# Patient Record
Sex: Female | Born: 2000 | Race: Black or African American | Hispanic: No | Marital: Single | Smoking: Never smoker
Health system: Southern US, Community
[De-identification: ages and names within clinical notes are randomized; demographics above are authoritative.]

## PROBLEM LIST (undated history)

## (undated) DIAGNOSIS — R000 Tachycardia, unspecified: Secondary | ICD-10-CM

---
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
## 2021-01-12 LAB — CULTURE, FUNGUS WITHOUT SMEAR

## 2021-07-06 ENCOUNTER — Other Ambulatory Visit (HOSPITAL_COMMUNITY): Payer: Self-pay | Admitting: Physician Assistant

## 2021-07-06 DIAGNOSIS — M7989 Other specified soft tissue disorders: Secondary | ICD-10-CM

## 2021-07-12 ENCOUNTER — Ambulatory Visit (HOSPITAL_COMMUNITY)
Admission: RE | Admit: 2021-07-12 | Discharge: 2021-07-12 | Disposition: A | Discharge status: Home / Self Care | Payer: BC Managed Care – PPO | Source: Ambulatory Visit | Attending: Physician Assistant | Admitting: Physician Assistant

## 2021-07-12 ENCOUNTER — Other Ambulatory Visit (HOSPITAL_COMMUNITY): Payer: Self-pay | Admitting: Physician Assistant

## 2021-07-12 ENCOUNTER — Other Ambulatory Visit: Payer: Self-pay

## 2021-07-12 DIAGNOSIS — M7989 Other specified soft tissue disorders: Secondary | ICD-10-CM

## 2021-08-04 ENCOUNTER — Emergency Department (HOSPITAL_COMMUNITY)
Admission: EM | Admit: 2021-08-04 | Discharge: 2021-08-04 | Disposition: A | Discharge status: Home / Self Care | Payer: BC Managed Care – PPO | Attending: Emergency Medicine | Admitting: Emergency Medicine

## 2021-08-04 ENCOUNTER — Encounter (HOSPITAL_COMMUNITY): Payer: Self-pay | Admitting: *Deleted

## 2021-08-04 DIAGNOSIS — M79605 Pain in left leg: Secondary | ICD-10-CM | POA: Diagnosis not present

## 2021-08-04 DIAGNOSIS — Z5321 Procedure and treatment not carried out due to patient leaving prior to being seen by health care provider: Secondary | ICD-10-CM | POA: Insufficient documentation

## 2021-08-04 NOTE — ED Triage Notes (Signed)
Left leg pain x 2 weeks

## 2022-03-13 IMAGING — MR MR THORACIC SPINE WO/W CM
7 of 9 series · 29 of 48 positions shown · IV contrast (gadavist)
Comparison: None.

CLINICAL DATA: Headache and fever.  Sepsis.

EXAM:
MRI THORACIC WITHOUT AND WITH CONTRAST
TECHNIQUE: Multiplanar and multiecho pulse sequences of the thoracic spine were
obtained without and with intravenous contrast.
CONTRAST:  7.5mL GADAVIST GADOBUTROL 1 MMOL/ML IV SOLN

[Series 18: T1 · sagittal · 3.3mm · 0.62mm/px · 1 of 9 slices shown (1 of 3)]
[im 1/9]
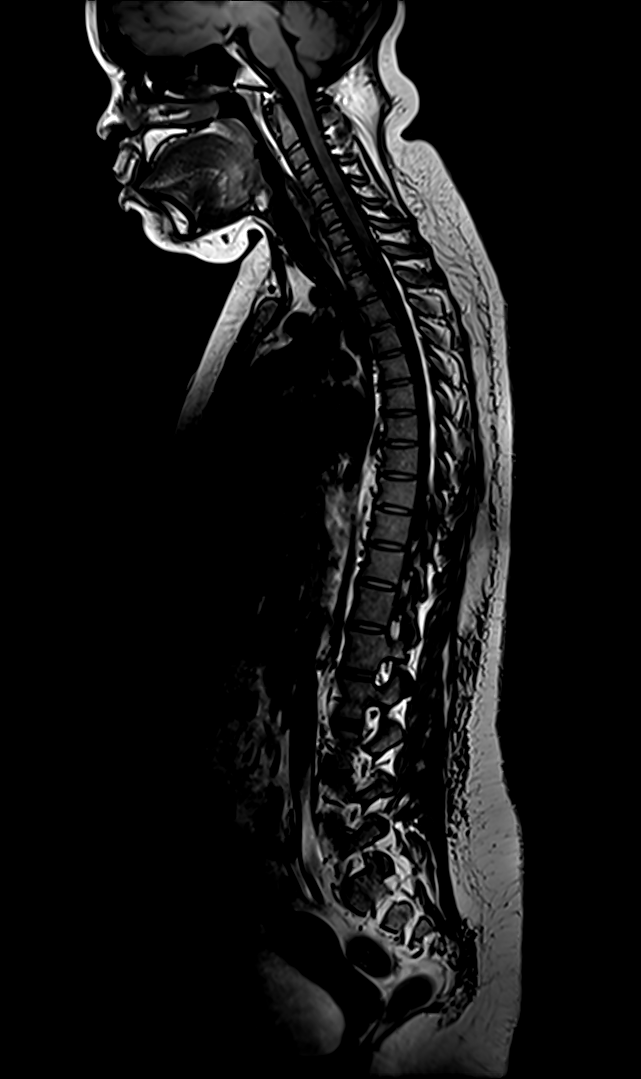

[Series 19: T2 · sagittal · 3.0mm · 1.06mm/px · 3 of 17 slices shown (1 of 2)]
[im 1/17]
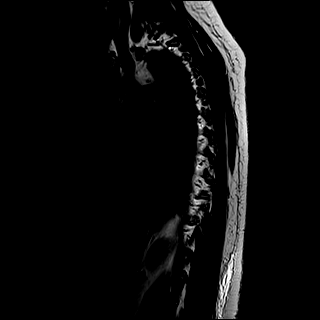
[im 9/17]
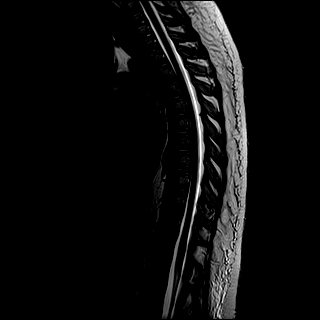
[im 17/17]
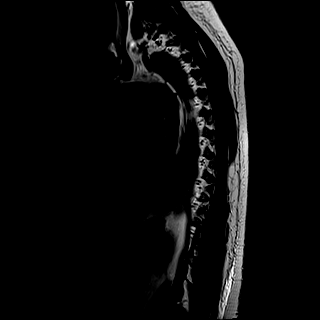

[Series 20: T1 · sagittal · 3.0mm · 1.33mm/px · 4 of 17 slices shown (2 of 3)]
[im 1/17]
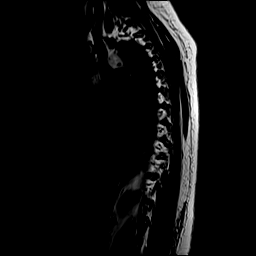
[im 6/17]
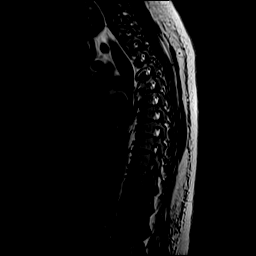
[im 11/17]
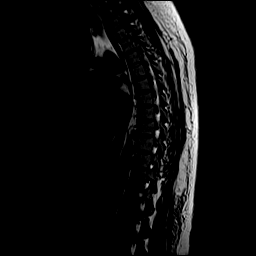
[im 17/17]
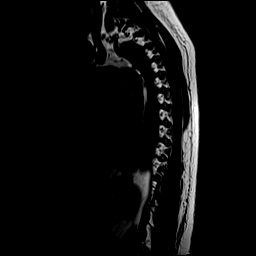

[Series 21: STIR · sagittal · 3.0mm · 0.53mm/px · 1 of 17 slices shown]
[im 1/17]
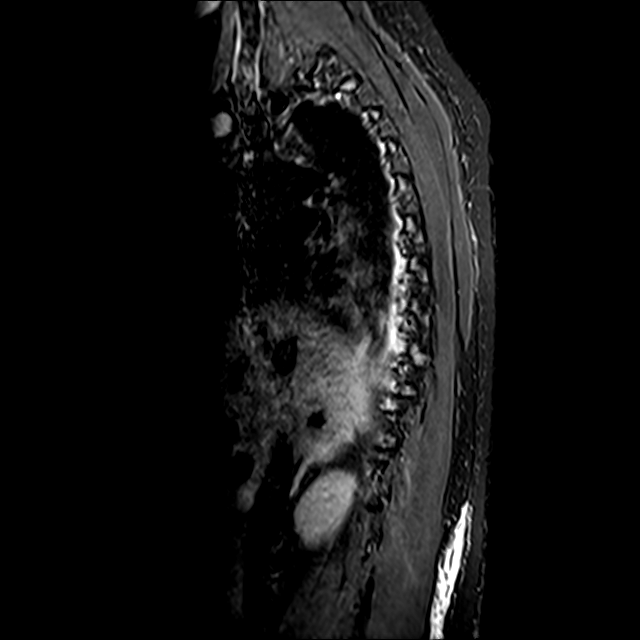

[Series 22: T2 · axial · 5.0mm · 0.74mm/px · z∈[-289,-78]mm · 8 of 39 slices shown (2 of 2)]
[im 1/39]
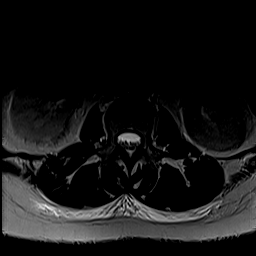
[im 6/39]
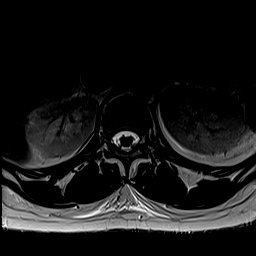
[im 11/39]
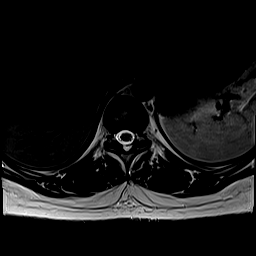
[im 17/39]
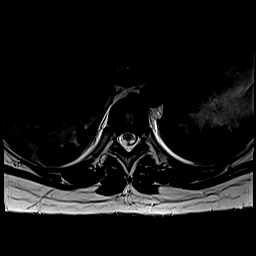
[im 22/39]
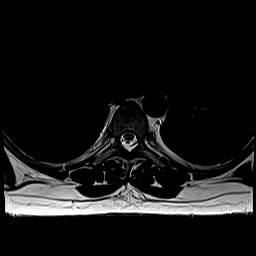
[im 28/39]
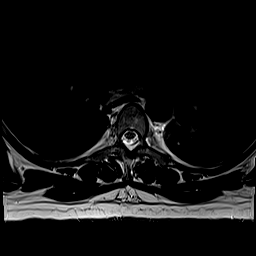
[im 33/39]
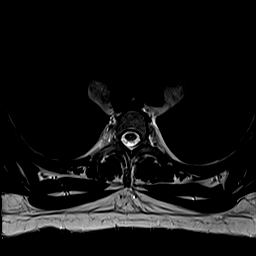
[im 39/39]
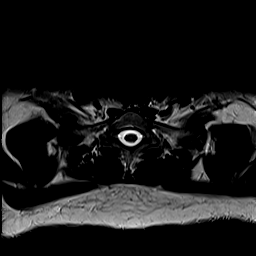

[Series 24: T1 · axial · non-contrast · 5.0mm · 0.37mm/px · z∈[-289,-78]mm · 8 of 39 slices shown (3 of 3)]
[im 1/39]
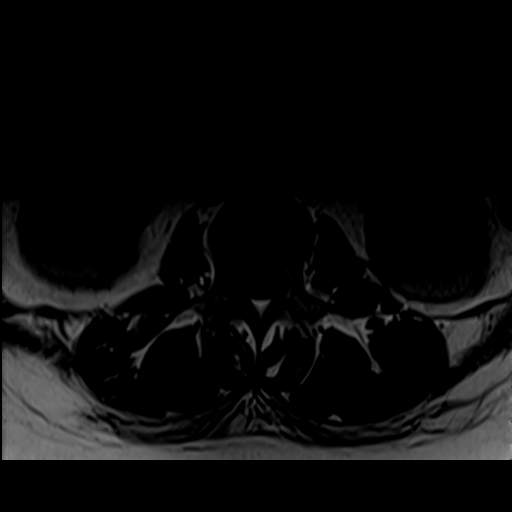
[im 6/39]
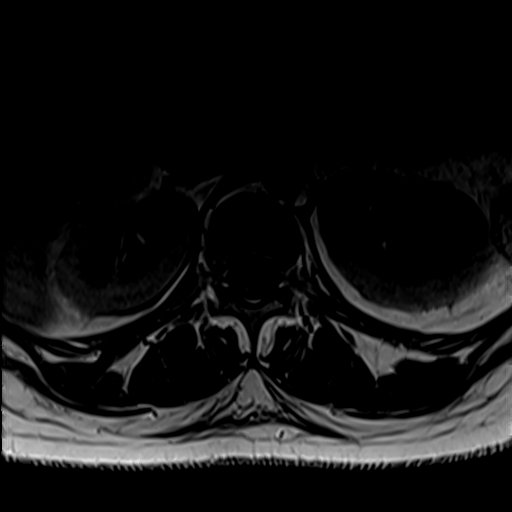
[im 11/39]
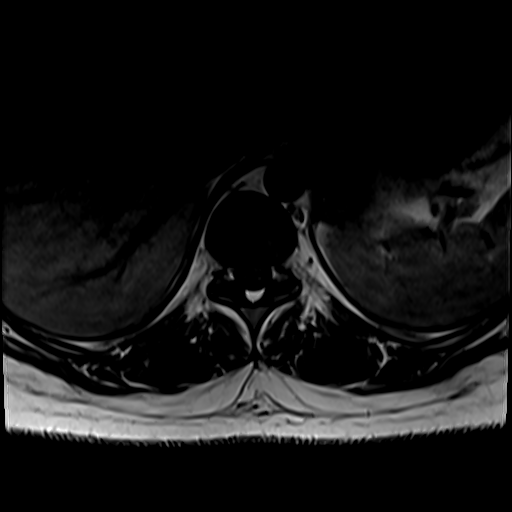
[im 17/39]
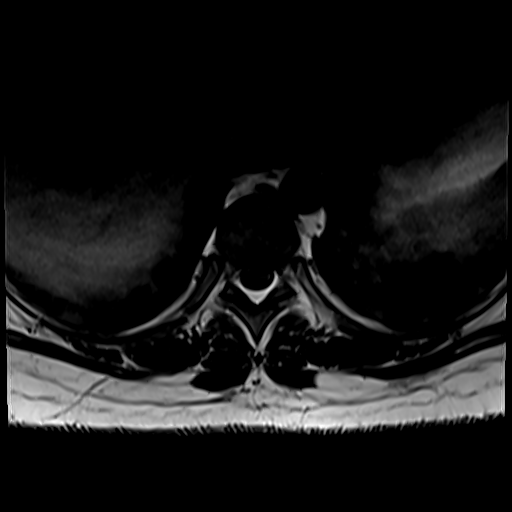
[im 22/39]
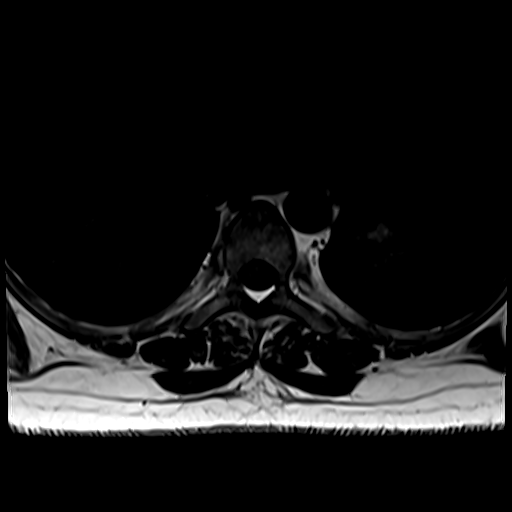
[im 28/39]
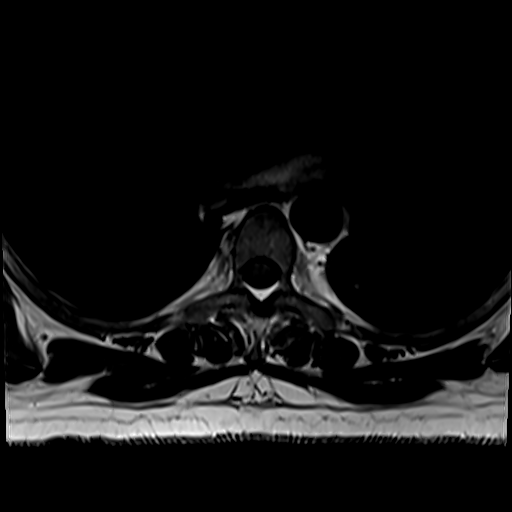
[im 33/39]
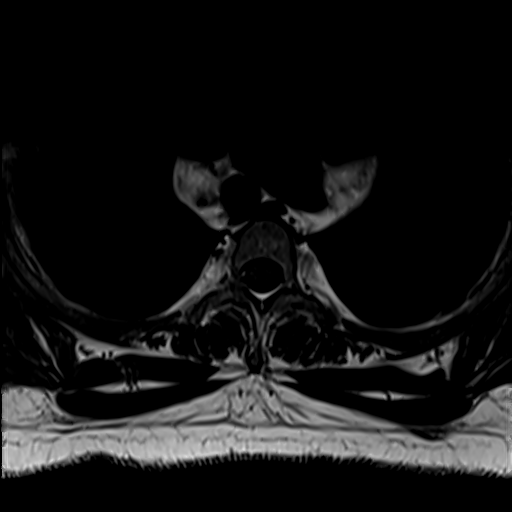
[im 39/39]
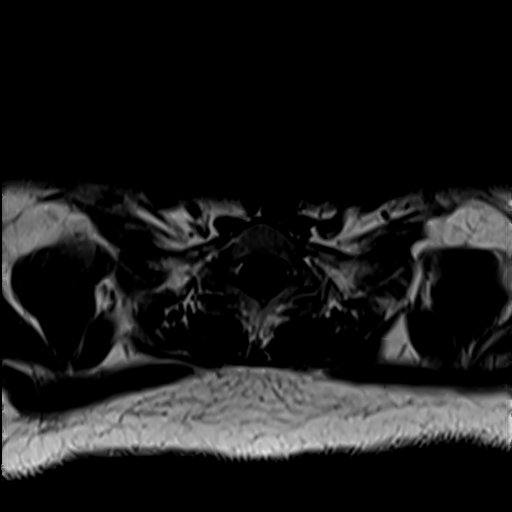

[Series 25: T1 fat-sat post-contrast · sagittal · 3.0mm · 1.06mm/px · 4 of 17 slices shown]
[im 1/17]
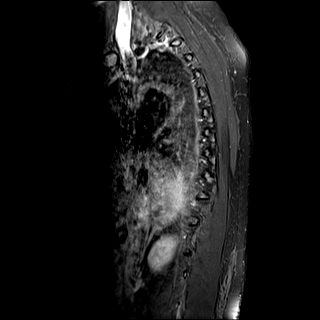
[im 6/17]
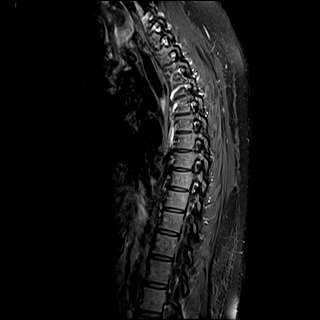
[im 11/17]
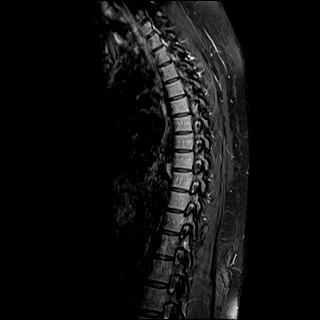
[im 17/17]
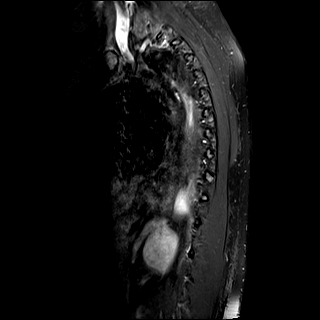

[29 of 48 positions shown; findings below may reference images not displayed]

FINDINGS: Alignment:  Normal

Vertebrae: Normal bone marrow. Negative for fracture or mass. No
evidence of spinal infection. Normal enhancement of the spine.

Cord:  Normal signal and morphology.  No cord compression.

Paraspinal and other soft tissues: Negative for paraspinous mass
edema or abscess. No pleural effusion.

Disc levels:

Normal disc spaces throughout
IMPRESSION: Normal MRI thoracic spine with contrast.

## 2022-03-13 IMAGING — MR MR CERVICAL SPINE WO/W CM
6 of 8 series · 32 of 48 positions shown · IV contrast (7.5 ml Gadavist)
Comparison: None.

CLINICAL DATA: Back pain with fever.

EXAM:
MRI CERVICAL SPINE WITHOUT AND WITH CONTRAST
TECHNIQUE: Multiplanar and multiecho pulse sequences of the cervical spine, to
include the craniocervical junction and cervicothoracic junction,
were obtained without and with intravenous contrast.
CONTRAST:  7.5mL GADAVIST GADOBUTROL 1 MMOL/ML IV SOLN

[Series 1: T2 · sagittal · 3.0mm · 0.62mm/px · 4 of 15 slices shown (1 of 2)]
[im 1/15]
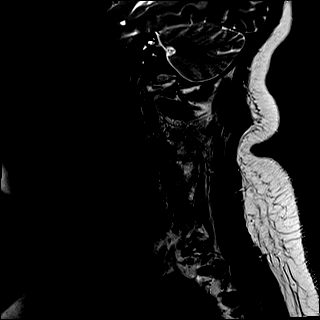
[im 5/15]
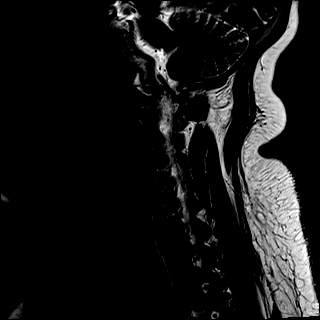
[im 10/15]
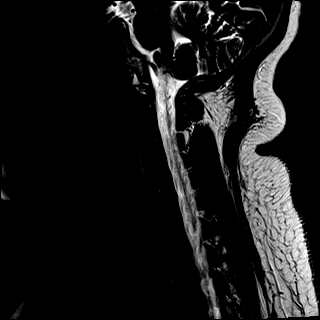
[im 15/15]
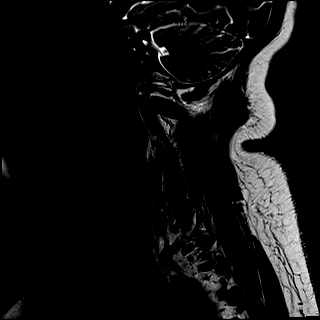

[Series 2: T1 · sagittal · 3.0mm · 0.62mm/px · 4 of 15 slices shown (1 of 2)]
[im 1/15]
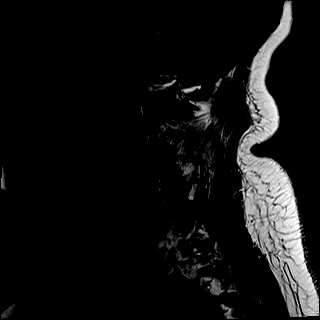
[im 5/15]
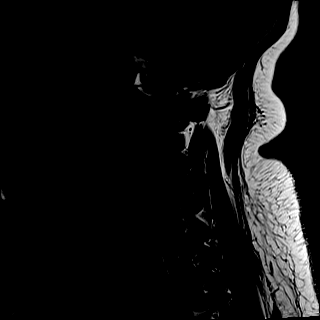
[im 10/15]
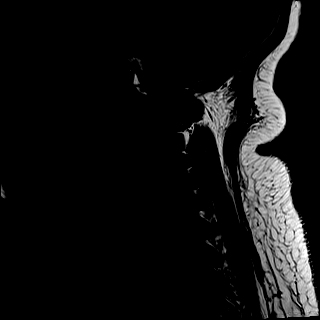
[im 15/15]
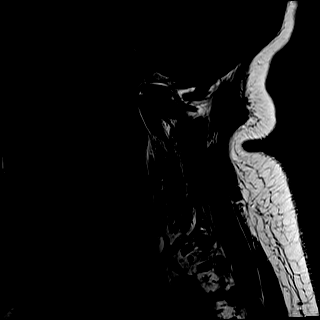

[Series 3: STIR · sagittal · 3.0mm · 0.78mm/px · 4 of 15 slices shown]
[im 1/15]
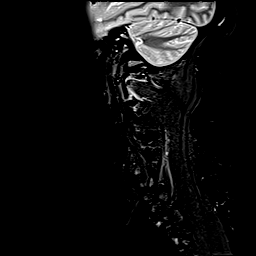
[im 5/15]
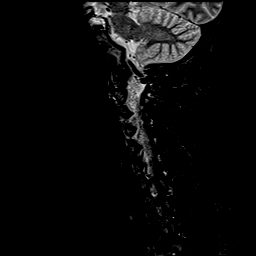
[im 10/15]
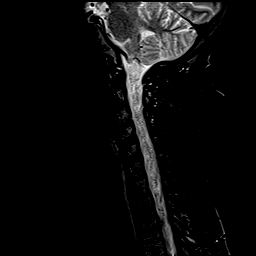
[im 15/15]
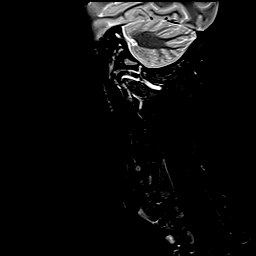

[Series 4: T2 · axial · 3.0mm · 0.66mm/px · z∈[-78,+26]mm · 8 of 36 slices shown (2 of 2)]
[im 1/36]
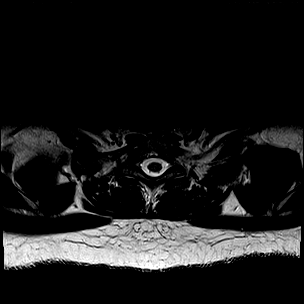
[im 6/36]
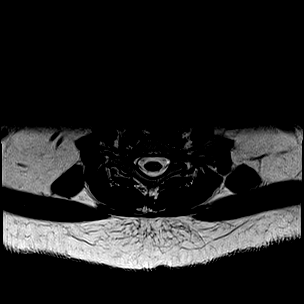
[im 11/36]
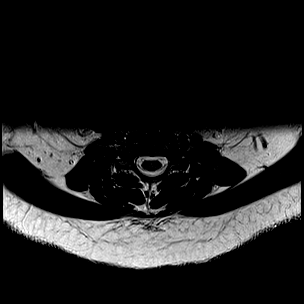
[im 16/36]
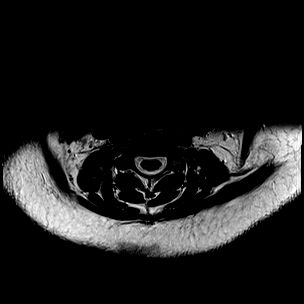
[im 21/36]
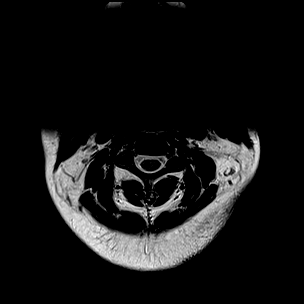
[im 26/36]
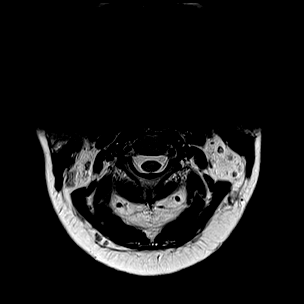
[im 31/36]
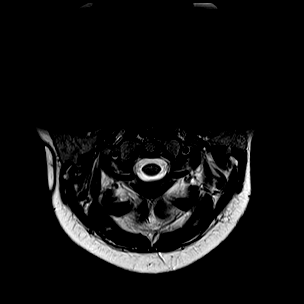
[im 36/36]
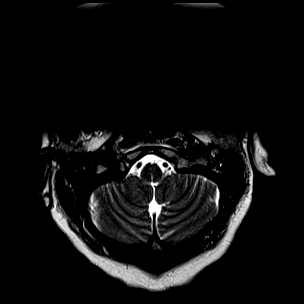

[Series 7: T1 · axial · non-contrast · 3.0mm · 0.35mm/px · z∈[-73,+31]mm · 8 of 36 slices shown (2 of 2)]
[im 1/36]
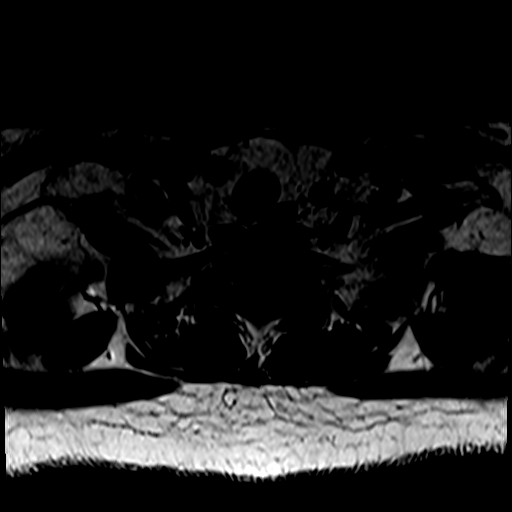
[im 6/36]
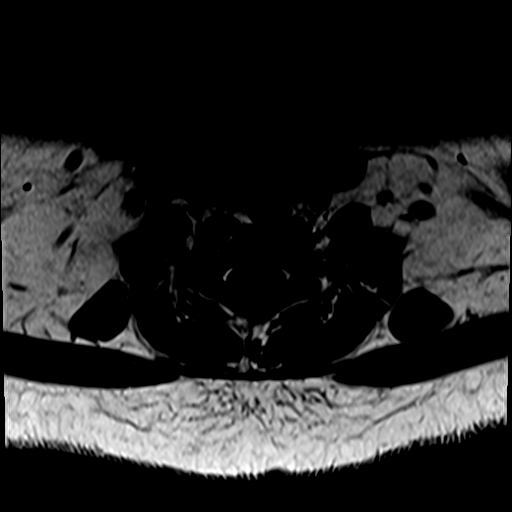
[im 11/36]
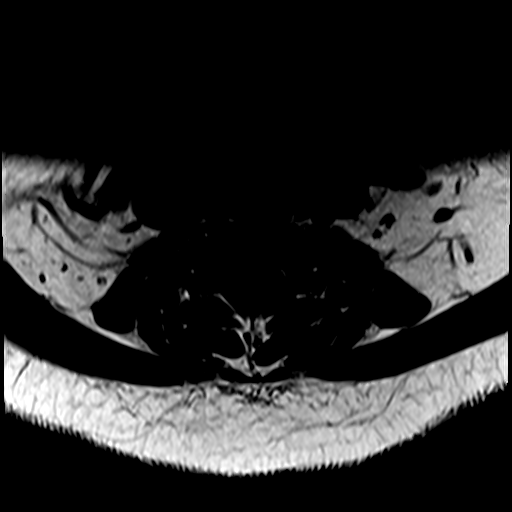
[im 16/36]
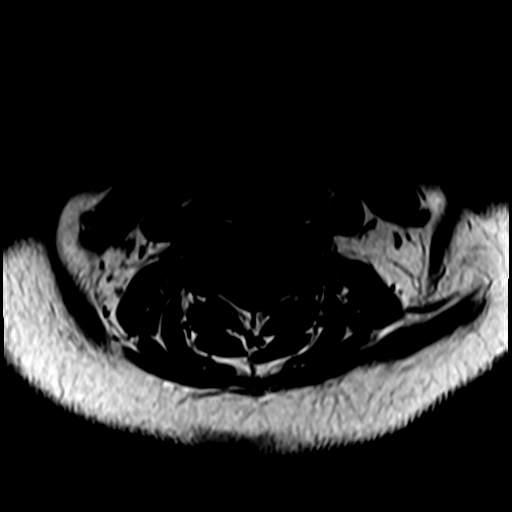
[im 21/36]
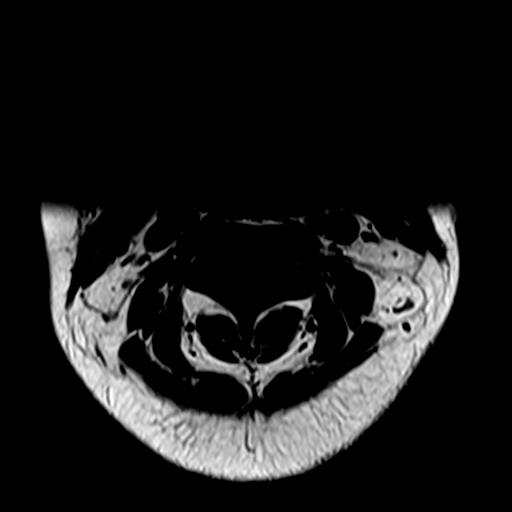
[im 26/36]
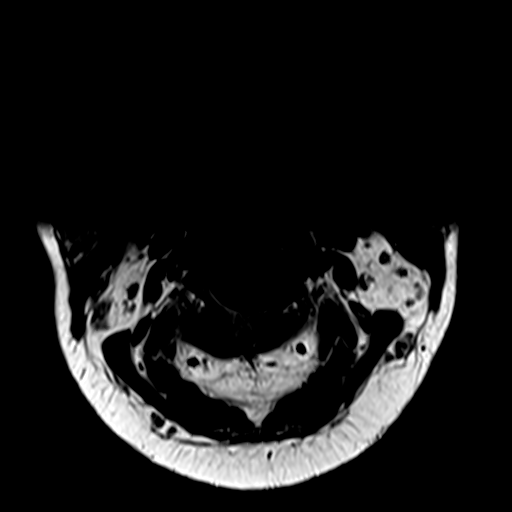
[im 31/36]
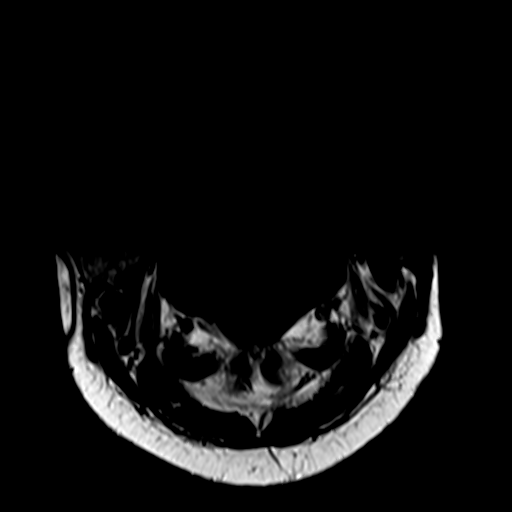
[im 36/36]
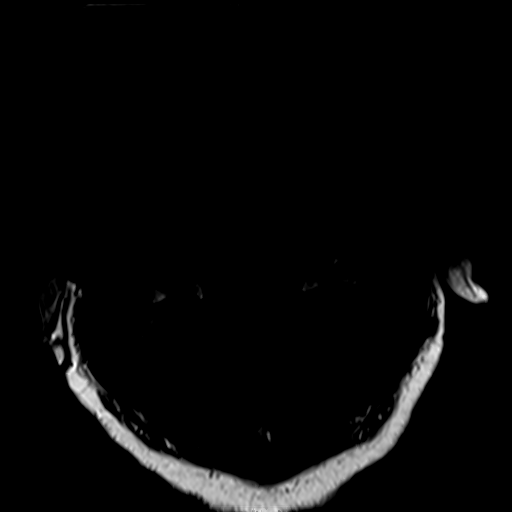

[Series 8: T1 post-contrast · sagittal · 3.0mm · 0.39mm/px · 4 of 15 slices shown]
[im 1/15]
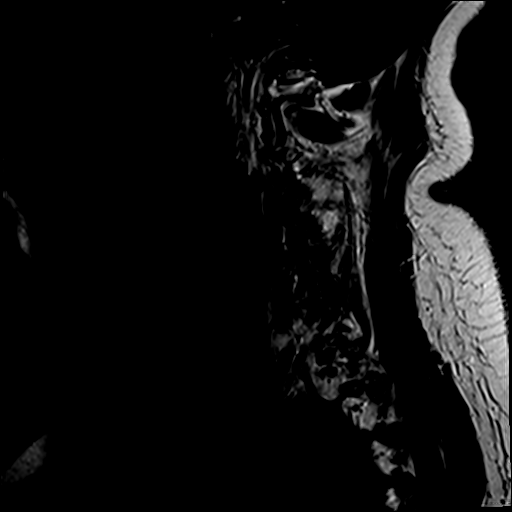
[im 5/15]
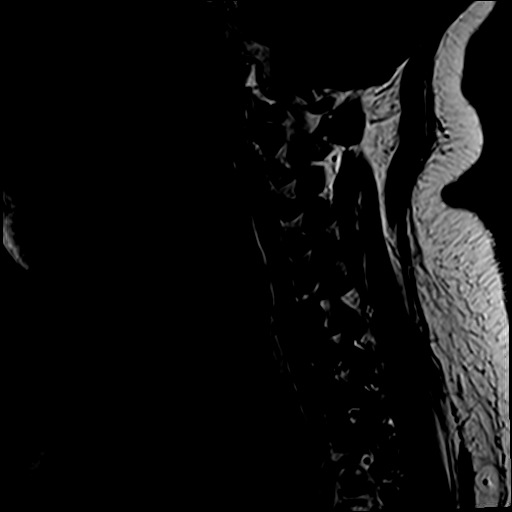
[im 10/15]
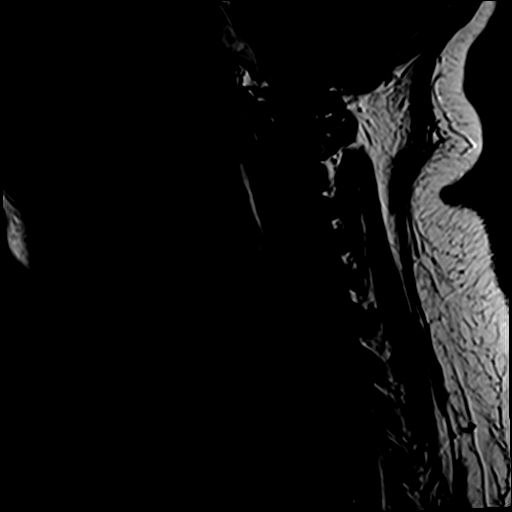
[im 15/15]
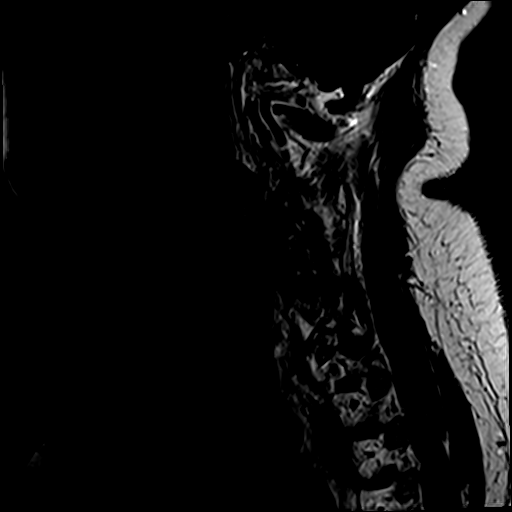

[32 of 48 positions shown; findings below may reference images not displayed]

FINDINGS: Alignment: Normal

Vertebrae: Normal bone marrow. Negative for fracture or mass. No
evidence of spinal infection.

Cord: Normal signal and morphology

Posterior Fossa, vertebral arteries, paraspinal tissues: Negative
for soft tissue edema or abscess.

Disc levels:

Normal disc spaces.  No disc protrusion or neural impingement.
IMPRESSION: Normal MRI cervical spine with contrast.

## 2023-03-31 ENCOUNTER — Encounter (HOSPITAL_COMMUNITY): Payer: Self-pay

## 2023-03-31 ENCOUNTER — Other Ambulatory Visit: Payer: Self-pay

## 2023-03-31 ENCOUNTER — Emergency Department (HOSPITAL_COMMUNITY)
Admission: EM | Admit: 2023-03-31 | Discharge: 2023-03-31 | Disposition: A | Discharge status: Home / Self Care | Payer: BC Managed Care – PPO | Source: Home / Self Care | Attending: Emergency Medicine | Admitting: Emergency Medicine

## 2023-03-31 DIAGNOSIS — R519 Headache, unspecified: Secondary | ICD-10-CM | POA: Diagnosis not present

## 2023-03-31 DIAGNOSIS — N3001 Acute cystitis with hematuria: Secondary | ICD-10-CM | POA: Diagnosis not present

## 2023-03-31 DIAGNOSIS — D649 Anemia, unspecified: Secondary | ICD-10-CM | POA: Diagnosis not present

## 2023-03-31 DIAGNOSIS — Z20822 Contact with and (suspected) exposure to covid-19: Secondary | ICD-10-CM | POA: Insufficient documentation

## 2023-03-31 DIAGNOSIS — R000 Tachycardia, unspecified: Secondary | ICD-10-CM | POA: Diagnosis not present

## 2023-03-31 DIAGNOSIS — R339 Retention of urine, unspecified: Secondary | ICD-10-CM | POA: Diagnosis present

## 2023-03-31 DIAGNOSIS — L299 Pruritus, unspecified: Secondary | ICD-10-CM | POA: Diagnosis not present

## 2023-03-31 HISTORY — DX: Tachycardia, unspecified: R00.0

## 2023-03-31 LAB — CBC WITH DIFFERENTIAL/PLATELET
Abs Immature Granulocytes: 0.02 10*3/uL (ref 0.00–0.07)
Basophils Absolute: 0 10*3/uL (ref 0.0–0.1)
Basophils Relative: 0 %
Eosinophils Absolute: 0.3 10*3/uL (ref 0.0–0.5)
Eosinophils Relative: 4 %
HCT: 32.7 % — ABNORMAL LOW (ref 36.0–46.0)
Hemoglobin: 10.1 g/dL — ABNORMAL LOW (ref 12.0–15.0)
Immature Granulocytes: 0 %
Lymphocytes Relative: 6 %
Lymphs Abs: 0.4 10*3/uL — ABNORMAL LOW (ref 0.7–4.0)
MCH: 20.1 pg — ABNORMAL LOW (ref 26.0–34.0)
MCHC: 30.9 g/dL (ref 30.0–36.0)
MCV: 65.1 fL — ABNORMAL LOW (ref 80.0–100.0)
Monocytes Absolute: 0.2 10*3/uL (ref 0.1–1.0)
Monocytes Relative: 3 %
Neutro Abs: 6.3 10*3/uL (ref 1.7–7.7)
Neutrophils Relative %: 87 %
Platelets: 360 10*3/uL (ref 150–400)
RBC: 5.02 MIL/uL (ref 3.87–5.11)
RDW: 20.6 % — ABNORMAL HIGH (ref 11.5–15.5)
WBC: 7.3 10*3/uL (ref 4.0–10.5)
nRBC: 0 % (ref 0.0–0.2)

## 2023-03-31 LAB — URINALYSIS, ROUTINE W REFLEX MICROSCOPIC
Glucose, UA: NEGATIVE mg/dL
Hgb urine dipstick: NEGATIVE
Ketones, ur: NEGATIVE mg/dL
Leukocytes,Ua: NEGATIVE
Nitrite: POSITIVE — AB
Protein, ur: 100 mg/dL — AB
Specific Gravity, Urine: 1.033 — ABNORMAL HIGH (ref 1.005–1.030)
pH: 5 (ref 5.0–8.0)

## 2023-03-31 LAB — TSH: TSH: 0.877 u[IU]/mL (ref 0.350–4.500)

## 2023-03-31 LAB — BASIC METABOLIC PANEL
Anion gap: 12 (ref 5–15)
BUN: 9 mg/dL (ref 6–20)
CO2: 18 mmol/L — ABNORMAL LOW (ref 22–32)
Calcium: 8.2 mg/dL — ABNORMAL LOW (ref 8.9–10.3)
Chloride: 102 mmol/L (ref 98–111)
Creatinine, Ser: 0.65 mg/dL (ref 0.44–1.00)
GFR, Estimated: >60 mL/min (ref >60)
Glucose, Bld: 129 mg/dL — ABNORMAL HIGH (ref 70–99)
Potassium: 3.2 mmol/L — ABNORMAL LOW (ref 3.5–5.1)
Sodium: 132 mmol/L — ABNORMAL LOW (ref 135–145)

## 2023-03-31 LAB — SARS CORONAVIRUS 2 BY RT PCR: SARS Coronavirus 2 by RT PCR: NEGATIVE

## 2023-03-31 MED ORDER — DEXAMETHASONE SODIUM PHOSPHATE 10 MG/ML IJ SOLN
10.0000 mg | Freq: Once | INTRAMUSCULAR | Status: AC
  Administered 2023-03-31: 10 mg via INTRAVENOUS
  Filled 2023-03-31: qty 1

## 2023-03-31 MED ORDER — KETOROLAC TROMETHAMINE 10 MG PO TABS
10.0000 mg | ORAL_TABLET | Freq: Once | ORAL | Status: AC
  Administered 2023-03-31: 10 mg via ORAL
  Filled 2023-03-31: qty 1

## 2023-03-31 MED ORDER — POTASSIUM CHLORIDE CRYS ER 20 MEQ PO TBCR
40.0000 meq | EXTENDED_RELEASE_TABLET | Freq: Once | ORAL | Status: AC
  Administered 2023-03-31: 40 meq via ORAL
  Filled 2023-03-31: qty 2

## 2023-03-31 MED ORDER — LACTATED RINGERS IV BOLUS
1000.0000 mL | Freq: Once | INTRAVENOUS | Status: AC
  Administered 2023-03-31: 1000 mL via INTRAVENOUS

## 2023-03-31 MED ORDER — PROPRANOLOL HCL 10 MG PO TABS: 10.0000 mg | ORAL_TABLET | Freq: Three times a day (TID) | ORAL | 0 refills | Status: AC

## 2023-03-31 MED ORDER — CEPHALEXIN 500 MG PO CAPS: 500.0000 mg | ORAL_CAPSULE | Freq: Once | ORAL | Status: DC

## 2023-03-31 MED ORDER — DIPHENHYDRAMINE HCL 50 MG/ML IJ SOLN
25.0000 mg | Freq: Once | INTRAMUSCULAR | Status: AC
  Administered 2023-03-31: 25 mg via INTRAVENOUS
  Filled 2023-03-31: qty 1

## 2023-03-31 MED ORDER — CEFDINIR 300 MG PO CAPS
600.0000 mg | ORAL_CAPSULE | Freq: Once | ORAL | Status: AC
  Administered 2023-03-31: 600 mg via ORAL
  Filled 2023-03-31: qty 2

## 2023-03-31 NOTE — ED Provider Notes (Signed)
[place] EMERGENCY DEPARTMENT AT [facility] Provider Note   CSN: [id number] Arrival date & time: 03/31/23  1452     History  Chief Complaint  Patient presents with   Headache    Anna Walter is a 22 y.o. female.  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  Has history of tachycardia, previously on propranolol but was stopped by her PCP months ago because she was having "head rushes" to see if this would help, tachycardia has continued so she is hoping she can go back on this.  She  states her usual heart rate when unmedicated is around 130. Presents the ER today complaining of subjective fever at home, body aches, and chills.  Started yesterday, she was seen yesterday at the ER at [facility].  She states she was noted to have a fast heart rate  She states she has CT scan of her chest to rule out a blood clot that was normal, had labs and urinalysis, she states the only abnormal finding was a UTI.  She did get some IV fluids which helped improve her heart rate.  She states it was 150s yesterday.   NO confusion, documented fever, neck stiffness, or other neuro findings that would suggest meningitis or other neurologic problem.    Sent with prescription for Macrobid.  The second toe total doses but did not take any additional view started having itching all over.  She took Benadryl last night with some relief.  Has not had any additional Benadryl today.  Improved with Benadryl today as well she has no hives, no mucosal lesions will give her a dose of dexamethasone as well.  She did also note that she has been taking Pyridium for her dysuria which she is also going to discontinue as she is not sure if it is the Pyridium or the Macrobid she is allergic to  Headache      Home Medications Prior to Admission medications   Medication Sig Start Date End Date Taking? Authorizing Provider   propranolol (INDERAL) 10 MG tablet Take 1 tablet (10 mg total) by mouth 3 (three) times daily. 03/31/23  Yes ,  A, PA-C  ferrous sulfate 325 (65 FE) MG EC tablet Take 325 mg by mouth every other day. 12/07/20 12/07/21  [provider]  gabapentin (NEURONTIN) 100 MG capsule Take 100 mg by mouth daily. 10/06/20   [provider]  loratadine (CLARITIN) 10 MG tablet Take 10 mg by mouth daily. 10/07/20   [provider]  mirabegron ER (MYRBETRIQ) 50 MG TB24 tablet Take by mouth. Patient not taking: No sig reported    [provider]  montelukast (SINGULAIR) 10 MG tablet Take 10 mg by mouth daily. 10/06/20   [provider]  pantoprazole (PROTONIX) 40 MG tablet Take 1 tablet by mouth daily. 12/16/20   [provider]  predniSONE (DELTASONE) 10 MG tablet Take 30 mg by mouth daily with breakfast. 11/03/20   [provider]  traZODone (DESYREL) 50 MG tablet Take 50 mg by mouth at bedtime. 12/16/20   [provider]      Allergies    Sulfa antibiotics and Sulfamethoxazole-trimethoprim    Review of Systems   Review of Systems  Neurological:  Positive for headaches.    Physical Exam Updated Vital Signs BP 132/74   Pulse (!) 126   Temp 98.9 F (37.2 C) (Oral)   Resp 14   Ht 5'  2" (1.575 m)   Wt 86.2 kg   LMP 03/15/2023   SpO2 98%   BMI 34.75 kg/m  Physical Exam Vitals and nursing note reviewed.  Constitutional:      General: She is not in acute distress.    Appearance: She is well-developed.  HENT:     Head: Normocephalic and atraumatic.  Eyes:     Extraocular Movements: Extraocular movements intact.     Conjunctiva/sclera: Conjunctivae normal.     Pupils: Pupils are equal, round, and reactive to light.  Cardiovascular:     Rate and Rhythm: Regular rhythm. Tachycardia present.     Heart sounds: No murmur heard. Pulmonary:     Effort: Pulmonary effort is normal. No respiratory distress.     Breath sounds:  Normal breath sounds.  Abdominal:     Palpations: Abdomen is soft.     Tenderness: There is no abdominal tenderness.  Musculoskeletal:        General: No swelling. Normal range of motion.     Cervical back: Neck supple.  Skin:    General: Skin is warm and dry.     Capillary Refill: Capillary refill takes less than 2 seconds.  Neurological:     Mental Status: She is alert and oriented to person, place, and time.     GCS: GCS eye subscore is 4. GCS verbal subscore is 5. GCS motor subscore is 6.     Cranial Nerves: No facial asymmetry.     Sensory: No sensory deficit.     Motor: No weakness.     Gait: Gait normal.  Psychiatric:        Mood and Affect: Mood normal.        Behavior: Behavior normal.     ED Results / Procedures / Treatments   Labs (all labs ordered are listed, but only abnormal results are displayed) Labs Reviewed  CBC WITH DIFFERENTIAL/PLATELET - Abnormal; Notable for the following components:      Result Value   Hemoglobin 10.1 (*)    HCT 32.7 (*)    MCV 65.1 (*)    MCH 20.1 (*)    RDW 20.6 (*)    Lymphs Abs 0.4 (*)    All other components within normal limits  BASIC METABOLIC PANEL - Abnormal; Notable for the following components:   Sodium 132 (*)    Potassium 3.2 (*)    CO2 18 (*)    Glucose, Bld 129 (*)    Calcium 8.2 (*)    All other components within normal limits  URINALYSIS, ROUTINE W REFLEX MICROSCOPIC - Abnormal; Notable for the following components:   Color, Urine AMBER (*)    APPearance HAZY (*)    Specific Gravity, Urine 1.033 (*)    Bilirubin Urine SMALL (*)    Protein, ur 100 (*)    Nitrite POSITIVE (*)    Bacteria, UA RARE (*)    All other components within normal limits  SARS CORONAVIRUS 2 BY RT PCR  URINE CULTURE  TSH    EKG EKG Interpretation Date/Time:  Sunday March 31 2023 18:28:09 EDT Ventricular Rate:  119 PR Interval:  130 QRS Duration:  77 QT Interval:  297 QTC Calculation: 418 R Axis:   62  Text  Interpretation: Sinus tachycardia Low voltage, precordial leads Borderline T abnormalities, diffuse leads since last tracing no significant change Confirmed by Eber Hong ([id number]) on 03/31/2023 6:32:38 PM  Radiology No results found.  Procedures Procedures    Medications Ordered in ED  Medications  cefdinir (OMNICEF) capsule 600 mg (has no administration in time range)  dexamethasone (DECADRON) injection 10 mg (has no administration in time range)  ketorolac (TORADOL) tablet 10 mg (has no administration in time range)  lactated ringers bolus 1,000 mL (1,000 mLs Intravenous New Bag/Given 03/31/23 1717)  diphenhydrAMINE (BENADRYL) injection 25 mg (25 mg Intravenous Given 03/31/23 1717)  potassium chloride SA (KLOR-CON M) CR tablet 40 mEq (40 mEq Oral Given 03/31/23 1723)    ED Course/ Medical Decision Making/ A&P Clinical Course as of 03/31/23 1919  Sun Mar 31, 2023  1903 TSH [CB]    Clinical Course User Index [CB] Ma Rings, [place]                                 Medical Decision Making This patient presents to the ED for concern of bodyaches and headaches and subjective fever, this involves an extensive number of treatment options, and is a complaint that carries with it a high risk of complications and morbidity.  The differential diagnosis includes viral illness, sepsis, UTI, COVID-19, meningitis, dehydration, other   Co morbidities that complicate the patient evaluation  Urinary retention, tachycardia   Additional history obtained:  Additional history obtained from EMR External records from outside source obtained and reviewed including notes   Lab Tests:  I Ordered, and personally interpreted labs.  The pertinent results include: Urinalysis significant for UTI, CBC shows mild anemia with hemoglobin of 10.1.  MP shows mild hyponatremia, mild hypokalemia, given oral repletion for her potassium.     Problem List / ED Course / Critical interventions / Medication  management  Tachycardia-patient has baseline tachycardia has off of her propranolol, initially tachycardia to 140s but resolved to 120s to 130s without intervention, 5115 after IV fluids, she has no fever, no leukocytosis she is not hypotensive.  Reports full workup at [facility] ED yesterday.  Unfortunately unable to assess these records but patient is able to tell me she had UTI, was told labs were reassuring and had negative CTA of her chest.  Labs here today are also reassuring, negative COVID.  Itching-started having itching after starting Macrobid for UTI, possibly milder allergic reaction, feels better after Benadryl.  Will plan to stop Macrobid and switch her to cephalexin.   Reevaluation of the patient after these medicines showed that the patient improved I have reviewed the patients home medicines and have made adjustments as needed    Test / Admission - Considered:  Imaging of chest but patient had negative CTA of her chest yesterday    Amount and/or Complexity of Data Reviewed Labs: ordered.  Risk Prescription drug management.           Final Clinical Impression(s) / ED Diagnoses Final diagnoses:  Acute cystitis with hematuria  Tachycardia  Pruritus    Rx / DC Orders ED Discharge Orders          Ordered    propranolol (INDERAL) 10 MG tablet  3 times daily        03/31/23 1909              Josem Kaufmann 03/31/23 1919    Eber Hong, MD 04/01/23 [phone]

## 2023-03-31 NOTE — Discharge Instructions (Addendum)
Today for body aches and chills.  Her heart rate is very fast but has improved.  Is likely in part due to your chronic tachycardia.  I have given a prescription for the first week of propranolol until your primary care doctor can get back to you about calling the prescription.  You given a prescription for antibiotics for UTI.  Stop the Macrobid since you seem to be allergic to that.  Come back to the ER for new or worsening symptoms.

## 2023-03-31 NOTE — ED Triage Notes (Addendum)
"  Headaches, body aches, fever since yesterday" per pt  Patient states she "was seen at [facility] last night and diagnosed with UTI and my heart rate was high then too, but I have been off of my propanolol x 3 months"
